# Patient Record
Sex: Male | Born: 1960 | Race: White | Hispanic: No | Marital: Married | Smoking: Former smoker
Health system: Southern US, Community
[De-identification: ages and names within clinical notes are randomized; demographics above are authoritative.]

## PROBLEM LIST (undated history)

## (undated) DIAGNOSIS — C801 Malignant (primary) neoplasm, unspecified: Secondary | ICD-10-CM

## (undated) DIAGNOSIS — M1990 Unspecified osteoarthritis, unspecified site: Secondary | ICD-10-CM

## (undated) DIAGNOSIS — G4730 Sleep apnea, unspecified: Secondary | ICD-10-CM

## (undated) DIAGNOSIS — K219 Gastro-esophageal reflux disease without esophagitis: Secondary | ICD-10-CM

## (undated) DIAGNOSIS — K635 Polyp of colon: Secondary | ICD-10-CM

## (undated) DIAGNOSIS — N400 Enlarged prostate without lower urinary tract symptoms: Secondary | ICD-10-CM

## (undated) HISTORY — DX: Polyp of colon: K63.5

## (undated) HISTORY — DX: Malignant (primary) neoplasm, unspecified: C80.1

## (undated) HISTORY — PX: CYST REMOVAL HAND: SHX6279

## (undated) HISTORY — DX: Unspecified osteoarthritis, unspecified site: M19.90

## (undated) HISTORY — PX: OTHER SURGICAL HISTORY: SHX169

## (undated) HISTORY — DX: Benign prostatic hyperplasia without lower urinary tract symptoms: N40.0

## (undated) HISTORY — DX: Sleep apnea, unspecified: G47.30

## (undated) HISTORY — PX: POLYPECTOMY: SHX149

## (undated) HISTORY — DX: Gastro-esophageal reflux disease without esophagitis: K21.9

---

## 2012-04-29 ENCOUNTER — Other Ambulatory Visit: Payer: Self-pay | Admitting: Sports Medicine

## 2012-04-29 DIAGNOSIS — M7540 Impingement syndrome of unspecified shoulder: Secondary | ICD-10-CM

## 2012-04-29 DIAGNOSIS — M1990 Unspecified osteoarthritis, unspecified site: Secondary | ICD-10-CM

## 2012-04-29 DIAGNOSIS — Z139 Encounter for screening, unspecified: Secondary | ICD-10-CM

## 2012-05-06 ENCOUNTER — Ambulatory Visit
Admission: RE | Admit: 2012-05-06 | Discharge: 2012-05-06 | Disposition: A | Discharge status: Home / Self Care | Payer: Worker's Compensation | Source: Ambulatory Visit | Attending: Sports Medicine | Admitting: Sports Medicine

## 2012-05-06 DIAGNOSIS — M7540 Impingement syndrome of unspecified shoulder: Secondary | ICD-10-CM

## 2012-05-06 DIAGNOSIS — M1990 Unspecified osteoarthritis, unspecified site: Secondary | ICD-10-CM

## 2012-05-06 DIAGNOSIS — Z139 Encounter for screening, unspecified: Secondary | ICD-10-CM

## 2012-09-02 HISTORY — PX: SHOULDER SURGERY: SHX246

## 2016-12-01 HISTORY — PX: COLONOSCOPY WITH ESOPHAGOGASTRODUODENOSCOPY (EGD): SHX5779

## 2016-12-01 HISTORY — PX: COLONOSCOPY: SHX174

## 2017-01-29 ENCOUNTER — Encounter: Payer: Self-pay | Admitting: Gastroenterology

## 2017-12-04 ENCOUNTER — Encounter: Payer: Self-pay | Admitting: Gastroenterology

## 2017-12-18 ENCOUNTER — Encounter: Payer: Self-pay | Admitting: Gastroenterology

## 2017-12-18 ENCOUNTER — Ambulatory Visit (INDEPENDENT_AMBULATORY_CARE_PROVIDER_SITE_OTHER): Payer: BC Managed Care – PPO | Admitting: Gastroenterology

## 2017-12-18 VITALS — BP 126/80 | HR 62 | Ht 72.0 in | Wt 197.5 lb

## 2017-12-18 DIAGNOSIS — R1013 Epigastric pain: Secondary | ICD-10-CM

## 2017-12-18 NOTE — Progress Notes (Signed)
      IMPRESSION and PLAN:    #1. Epigastric pain/RUQ pain (prev EGD 12/2016 showed healed distal esophageal erosions) -Protonix 40mg  po qd to continue. -US complete (CT was not approved by INS). If ultrasound is negative and he still has problems, would proceed with HIDA scan with ejection fraction possibly followed by a CT scan of the abdomen pelvis. -Rpt EGD in 3-4 [place] if not better. -Avoid nonsteroidals.     #2. H/O Polyps (colonoscopy 01/29/2017 small colonic polyp -we just got the records)      HPI:    Chief Complaint:   [age]yr old with upper abdominal pain x Feb 2019, better with eating. No NSAIDs. Started on protonix, with some relief.  He does complain of abdominal bloating.  No nausea, vomiting, heartburn, regurgitation, odynophagia or dysphagia.  No significant diarrhea or constipation currently.  There is no melena or hematochezia. No unintentional weight loss.   Normal CBC, CMP, lipase per notes from Dr Yetta FlockHodges.  We do not have the rest of the records -awaiting Deconversion.   Past Surgical History:  Procedure Laterality Date  . COLONOSCOPY  12/2016   polyps  . COLONOSCOPY   12/2016  . CYST REMOVAL HAND     from L ear, r shoulder, r e\index finger  . SHOULDER SURGERY  2014    Review of systems:       Past Medical History:  Diagnosis Date  . Colon polyps 10/2016    Current Outpatient Medications  Medication Sig Dispense Refill  . Multiple Vitamins-Minerals (MULTIVITAMIN ADULT PO) Take by mouth daily.    . pantoprazole (PROTONIX) 40 MG tablet Take 40 mg by mouth daily.    . Probiotic Product (PROBIOTIC-10 PO) Take by mouth daily.     No current facility-administered medications for this visit.     Patient's surgical history, family medical history, social history and allergies were all reviewed in Epic    Physical Exam:     BP 126/80   Pulse 62   Ht 6' (1.829 m)   Wt 197 lb 8 oz (89.6 kg)   BMI 26.79 kg/m   GENERAL:  Alert, oriented,  cooperative, not in acute distress. PSYCH: :Pleasant, normal mood and affect. HEENT:  conjunctiva pink, mucous membranes moist, neck supple without masses. No jaundice. CARDIAC:  S1 S2 normal. No murmers. PULM: Normal respiratory effort, lungs CTA bilaterally, no wheezing. ABDOMEN: Inspection: No visible peristalsis, no abnormal pulsations, skin normal.  Palpation/percussion: Soft, nontender, nondistended, no rigidity, no abnormal dullness to percussion, no hepatosplenomegaly and no palpable abdominal masses. Small umblical hernia. Auscultation: Normal bowel sounds, no abdominal bruits. Rectal exam: Deferred SKIN:  turgor, no lesions seen. Musculoskeletal:  Normal muscle tone, normal strength. NEURO: Alert and oriented x 3, no focal neurologic deficits.   Virtie Bungert,MD 12/18/2017, 9:58 AM   Cc Dr Yetta FlockHodges

## 2017-12-18 NOTE — Patient Instructions (Signed)
If you are age 55 or older, your body mass index should be between 23-30. Your Body mass index is 26.79 kg/m. If this is out of the aforementioned range listed, please consider follow up with your Primary Care Provider.  If you are age 55 or younger, your body mass index should be between 19-25. Your Body mass index is 26.79 kg/m. If this is out of the aformentioned range listed, please consider follow up with your Primary Care Provider.   You have been scheduled for an abdominal ultrasound at [facility] (1st floor [address] ) on 12/19/17 at 9:30am. Please arrive 15 minutes prior to your appointment for registration. Make certain not to have anything to eat or drink 6 hours prior to your appointment. Should you need to reschedule your appointment, please contact radiology at [phone]. This test typically takes about 30 minutes to perform.  You have been scheduled for an endoscopy. Please follow written instructions given to you at your visit today. If you use inhalers (even only as needed), please bring them with you on the day of your procedure. Your physician has requested that you go to www.startemmi.com and enter the access code given to you at your visit today. This web site gives a general overview about your procedure. However, you should still follow specific instructions given to you by our office regarding your preparation for the procedure.  Thank you,  Dr. Lynann Bolognaajesh Gupta

## 2017-12-19 ENCOUNTER — Ambulatory Visit (HOSPITAL_BASED_OUTPATIENT_CLINIC_OR_DEPARTMENT_OTHER)
Admission: RE | Admit: 2017-12-19 | Discharge: 2017-12-19 | Disposition: A | Discharge status: Home / Self Care | Payer: BC Managed Care – PPO | Source: Ambulatory Visit | Attending: Gastroenterology | Admitting: Gastroenterology

## 2017-12-19 DIAGNOSIS — D739 Disease of spleen, unspecified: Secondary | ICD-10-CM | POA: Diagnosis not present

## 2017-12-19 DIAGNOSIS — R1013 Epigastric pain: Secondary | ICD-10-CM

## 2017-12-23 ENCOUNTER — Telehealth: Payer: Self-pay | Admitting: Gastroenterology

## 2017-12-23 NOTE — Telephone Encounter (Signed)
He is requesting result from his ultrasound.  Please advise, thank you.

## 2017-12-23 NOTE — Telephone Encounter (Signed)
US - look good. Please inform patient about the results.   IMPRESSION: Possible gallbladder polyp. Mildly hyperechoic liver - FATTY LIVER 12 x 13 mm hyperechoic lesion in the splenic hilum, likely a benign lesion such as hemangioma (BENIGN FINDING - fairly common - doesnot need any further WU.  Elby Showersaj

## 2017-12-24 NOTE — Telephone Encounter (Signed)
Called patient and gave him the results of the ultrasound.

## 2017-12-24 NOTE — Telephone Encounter (Signed)
Pt returned your call from yesterday regarding his results. Pls call him again. Thank you

## 2018-01-30 ENCOUNTER — Encounter: Payer: Self-pay | Admitting: Gastroenterology

## 2018-01-30 ENCOUNTER — Ambulatory Visit (AMBULATORY_SURGERY_CENTER): Payer: BC Managed Care – PPO | Admitting: Gastroenterology

## 2018-01-30 ENCOUNTER — Other Ambulatory Visit: Payer: Self-pay

## 2018-01-30 VITALS — BP 110/76 | HR 60 | Temp 97.7°F | Resp 17 | Ht 72.0 in | Wt 197.0 lb

## 2018-01-30 DIAGNOSIS — K2970 Gastritis, unspecified, without bleeding: Secondary | ICD-10-CM

## 2018-01-30 DIAGNOSIS — K2980 Duodenitis without bleeding: Secondary | ICD-10-CM

## 2018-01-30 DIAGNOSIS — R1013 Epigastric pain: Secondary | ICD-10-CM | POA: Diagnosis not present

## 2018-01-30 MED ORDER — SODIUM CHLORIDE 0.9 % IV SOLN: 500.0000 mL | Freq: Once | INTRAVENOUS | Status: DC

## 2018-01-30 MED ORDER — PANTOPRAZOLE SODIUM 40 MG PO TBEC: 40.0000 mg | DELAYED_RELEASE_TABLET | Freq: Every day | ORAL | 3 refills | Status: AC

## 2018-01-30 NOTE — Patient Instructions (Signed)
YOU HAD AN ENDOSCOPIC PROCEDURE TODAY AT THE [facility]:   Refer to the procedure report that was given to you for any specific questions about what was found during the examination.  If the procedure report does not answer your questions, please call your gastroenterologist to clarify.  If you requested that your care partner not be given the details of your procedure findings, then the procedure report has been included in a sealed envelope for you to review at your convenience later.  YOU SHOULD EXPECT: Some feelings of bloating in the abdomen. Passage of more gas than usual.  Walking can help get rid of the air that was put into your GI tract during the procedure and reduce the bloating. If you had a lower endoscopy (such as a colonoscopy or flexible sigmoidoscopy) you may notice spotting of blood in your stool or on the toilet paper. If you underwent a bowel prep for your procedure, you may not have a normal bowel movement for a few days.  Please Note:  You might notice some irritation and congestion in your nose or some drainage.  This is from the oxygen used during your procedure.  There is no need for concern and it should clear up in a day or so.  SYMPTOMS TO REPORT IMMEDIATELY:    Following upper endoscopy (EGD)  Vomiting of blood or coffee ground material  New chest pain or pain under the shoulder blades  Painful or persistently difficult swallowing  New shortness of breath  Fever of 100F or higher  Black, tarry-looking stools  For urgent or emergent issues, a gastroenterologist can be reached at any hour by calling [phone]. NO IBUPROFEN,ALEVE NOR ASPIRIN  PER DR GUPTA.  DIET:  We do recommend a small meal at first, but then you may proceed to your regular diet.  Drink plenty of fluids but you should avoid alcoholic beverages for 24 hours.  ACTIVITY:  You should plan to take it easy for the rest of today and you should NOT DRIVE or use heavy machinery until  tomorrow (because of the sedation medicines used during the test).    FOLLOW UP: Our staff will call the number listed on your records the next business day following your procedure to check on you and address any questions or concerns that you may have regarding the information given to you following your procedure. If we do not reach you, we will leave a message.  However, if you are feeling well and you are not experiencing any problems, there is no need to return our call.  We will assume that you have returned to your regular daily activities without incident.  If any biopsies were taken you will be contacted by phone or by letter within the next 1-3 [place].  Please call us at [phone] if you have not heard about the biopsies in 3 [place].    SIGNATURES/CONFIDENTIALITY: You and/or your care partner have signed paperwork which will be entered into your electronic medical record.  These signatures attest to the fact that that the information above on your After Visit Summary has been reviewed and is understood.  Full responsibility of the confidentiality of this discharge information lies with you and/or your care-partner.  Take your new medications as directed.

## 2018-01-30 NOTE — Op Note (Signed)
[place] Patient Name: Brian Mcfarland Procedure Date: 01/30/2018 1:56 PM MRN: [id number] Endoscopist: Jackquline Denmark , MD Age: 57 Referring MD:  Date of Birth: July 03, 1961 Gender: Male Account #: [id number] Procedure:                Upper GI endoscopy Indications:              Epigastric abdominal pain Medicines:                Monitored Anesthesia Care Procedure:                Pre-Anesthesia Assessment:                           - Prior to the procedure, a History and Physical                            was performed, and patient medications and                            allergies were reviewed. The patient is competent.                            The risks and benefits of the procedure and the                            sedation options and risks were discussed with the                            patient. All questions were answered and informed                            consent was obtained. Patient identification and                            proposed procedure were verified by the physician                            in the procedure room. Mental Status Examination:                            alert and oriented. Prophylactic Antibiotics: The                            patient does not require prophylactic antibiotics.                            Prior Anticoagulants: The patient has taken no                            previous anticoagulant or antiplatelet agents. ASA                            Grade Assessment: II - A patient with mild systemic  disease. After reviewing the risks and benefits,                            the patient was deemed in satisfactory condition to                            undergo the procedure. The anesthesia plan was to                            use monitored anesthesia care (MAC). Immediately                            prior to administration of medications, the patient                            was re-assessed for  adequacy to receive sedatives.                            The heart rate, respiratory rate, oxygen                            saturations, blood pressure, adequacy of pulmonary                            ventilation, and response to care were monitored                            throughout the procedure. The physical status of                            the patient was re-assessed after the procedure.                           After obtaining informed consent, the endoscope was                            passed under direct vision. Throughout the                            procedure, the patient's blood pressure, pulse, and                            oxygen saturations were monitored continuously. The                            Model GIF-HQ190 [id number]) scope was introduced                            through the mouth, and advanced to the second part                            of duodenum. The upper GI endoscopy was  accomplished without difficulty. The patient                            tolerated the procedure well. Scope In: Scope Out: Findings:                 Localized mild inflammation characterized by                            erosions was found in the gastric antrum. Biopsies                            were taken with a cold forceps for histology.                           Localized mildly erythematous mucosa without active                            bleeding and with no stigmata of bleeding was found                            in the duodenal bulb.                           The exam was otherwise without abnormality. Complications:            No immediate complications. Estimated Blood Loss:     Estimated blood loss: none. Impression:               - Erosive gastritis. Biopsied.                           - Mild duodenitis. Recommendation:           - Resume previous diet.                           - Continue present medications - Protonix 24m po                             qd.                           - Await pathology results.                           - No ibuprofen, naproxen, or other non-steroidal                            anti-inflammatory drugs.                           - Return to GI office PRN. RJackquline Denmark MD 01/30/2018 2:14:36 PM This report has been signed electronically.

## 2018-01-30 NOTE — Progress Notes (Signed)
To recovery, report to RN, VSS. 

## 2018-01-30 NOTE — Progress Notes (Signed)
Called to room to assist during endoscopic procedure.  Patient ID and intended procedure confirmed with present staff. Received instructions for my participation in the procedure from the performing physician.  

## 2018-02-02 ENCOUNTER — Telehealth: Payer: Self-pay

## 2018-02-02 NOTE — Telephone Encounter (Signed)
  Follow up Call-  Call back number 01/30/2018  Post procedure Call Back phone  # [phone]  Permission to leave phone message Yes  Some recent data might be hidden     Patient questions:  Do you have a fever, pain , or abdominal swelling? No. Pain Score  0 *  Have you tolerated food without any problems? Yes.    Have you been able to return to your normal activities? Yes.    Do you have any questions about your discharge instructions: Diet   No. Medications  No. Follow up visit  No.  Do you have questions or concerns about your Care? No.  Actions: * If pain score is 4 or above: No action needed, pain <4.

## 2018-02-05 ENCOUNTER — Encounter: Payer: Self-pay | Admitting: Gastroenterology

## 2019-06-24 IMAGING — US US ABDOMEN COMPLETE
1 series · 14 of 25 positions shown · non-contrast
Comparison: CT abdomen pelvis 12/06/2016

CLINICAL DATA: Epigastric pain

EXAM:
ABDOMEN ULTRASOUND COMPLETE

[Series 1: us abdomen complete · 0.28mm/px · 14 of 121 slices shown]
[im 1/121]
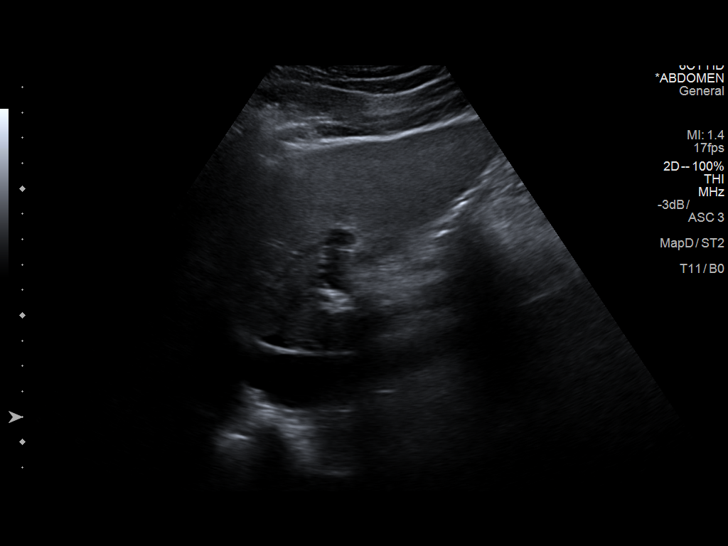
[im 11/121]
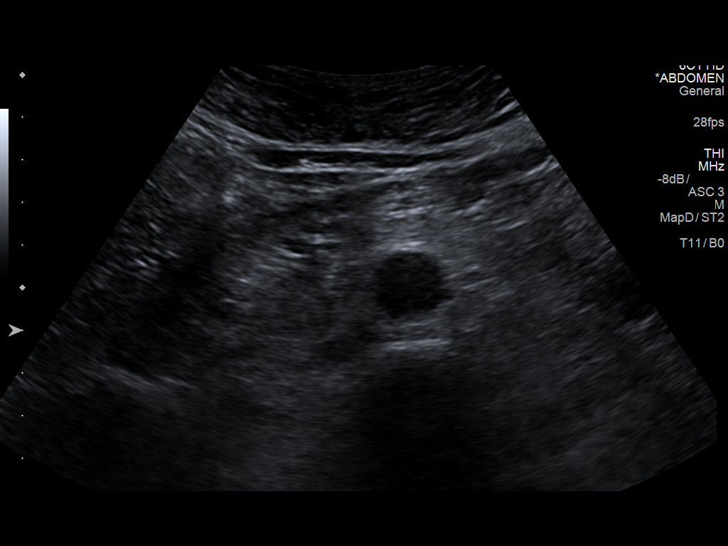
[im 21/121]
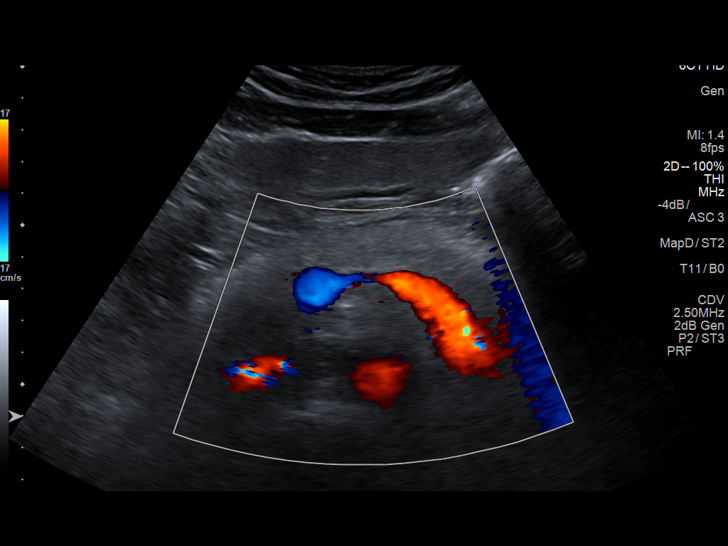
[im 31/121]
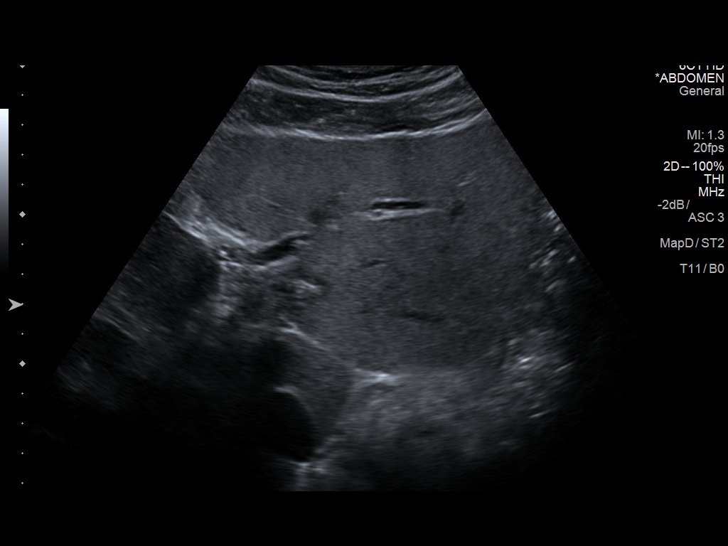
[im 41/121]
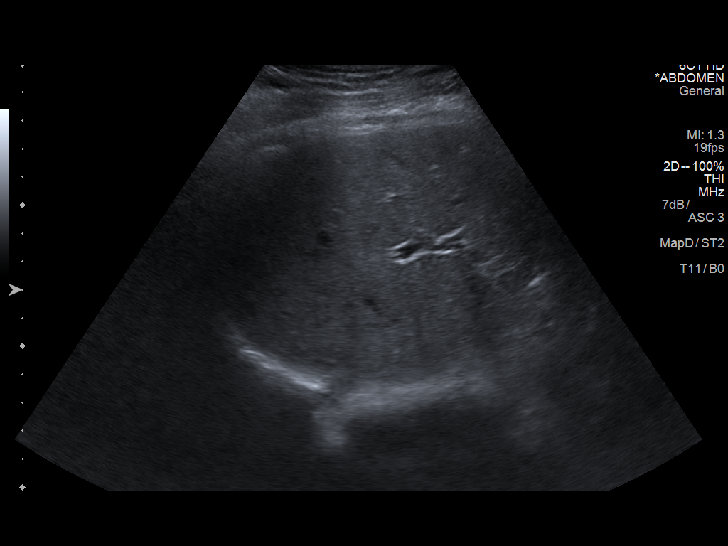
[im 46/121]
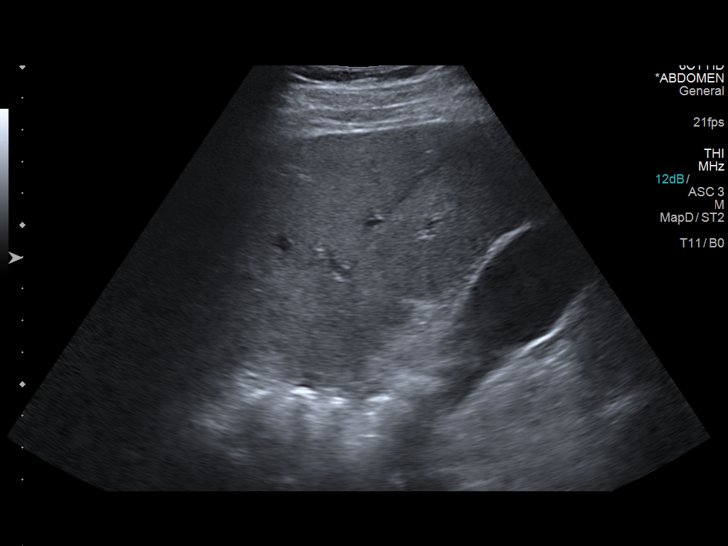
[im 56/121]
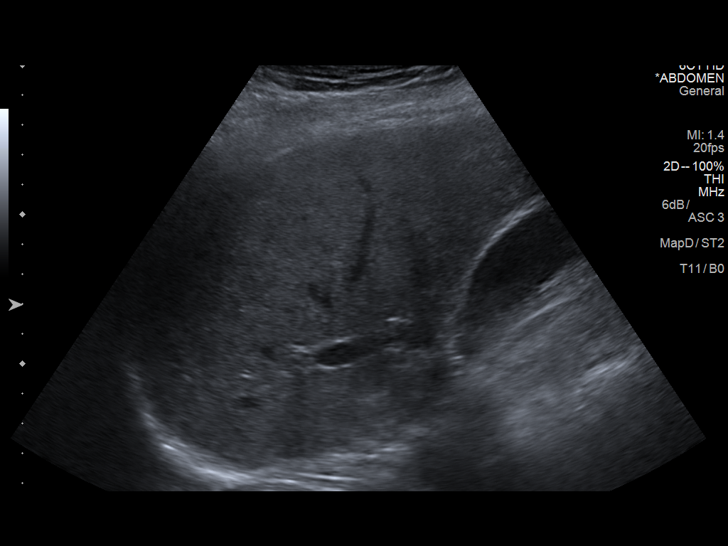
[im 66/121]
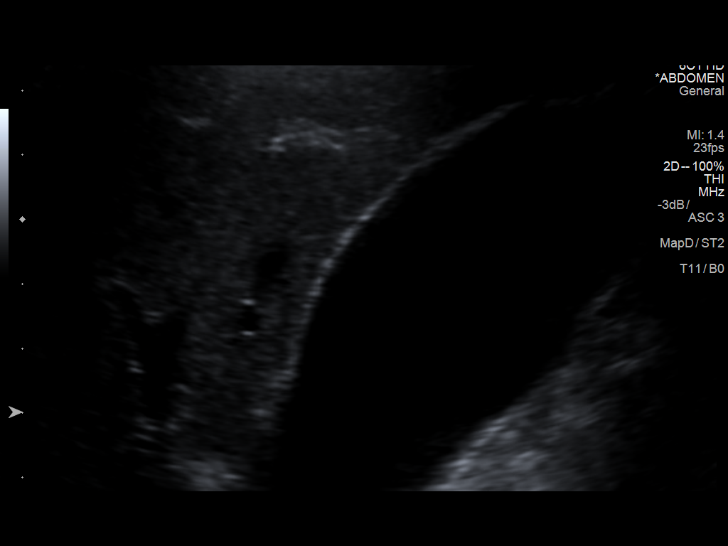
[im 76/121]
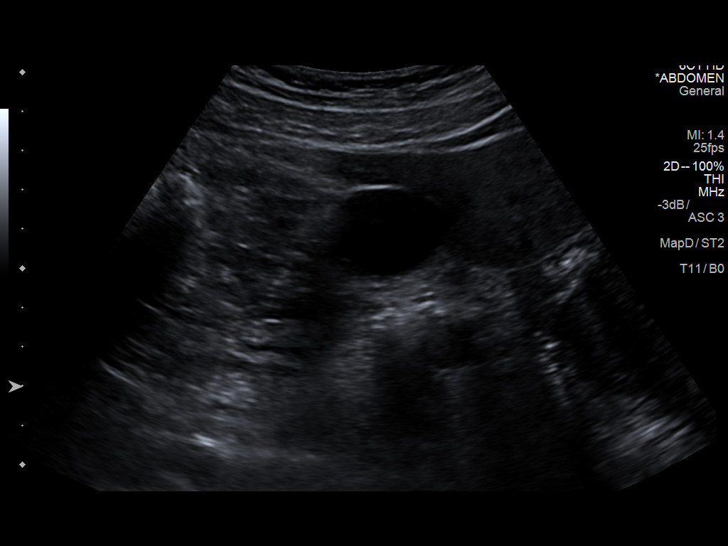
[im 81/121]
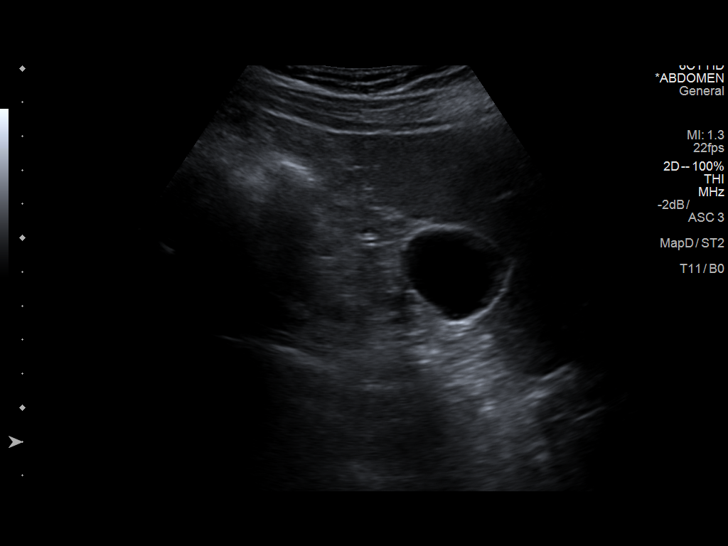
[im 91/121]
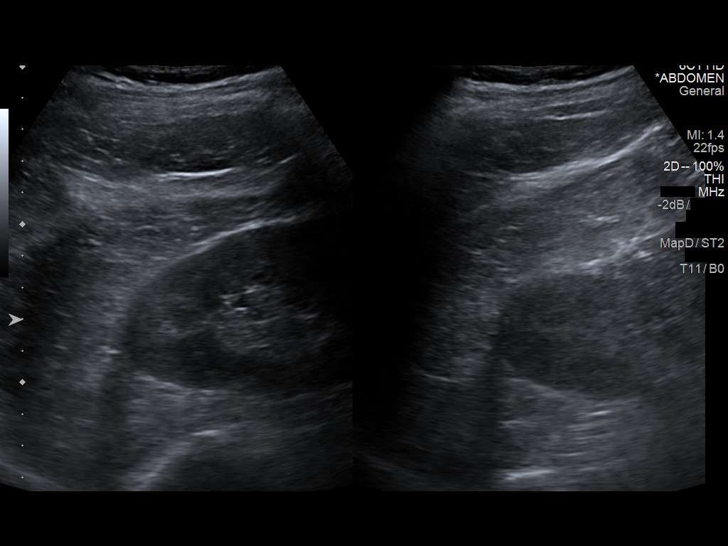
[im 101/121]
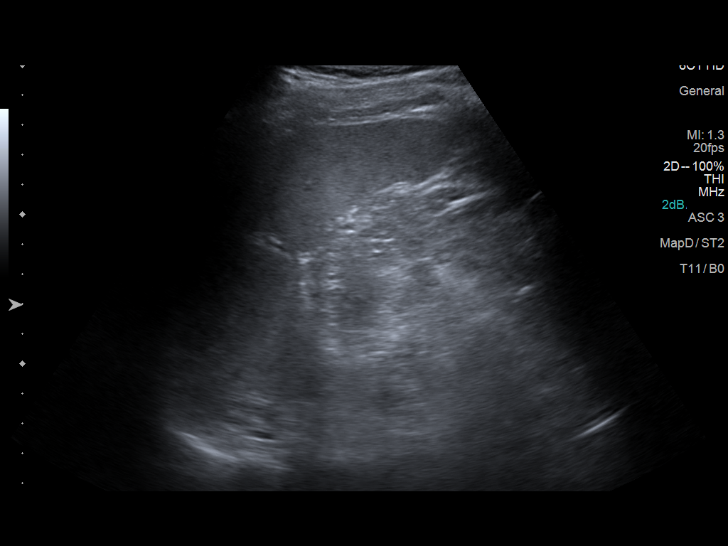
[im 111/121]
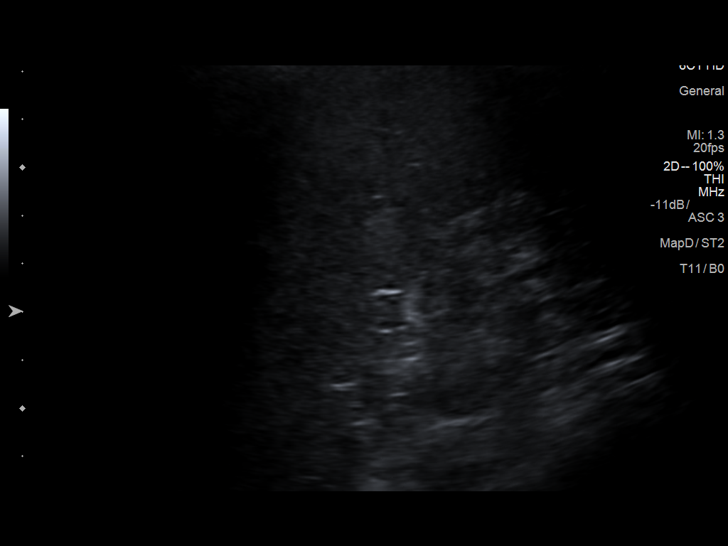
[im 121/121]
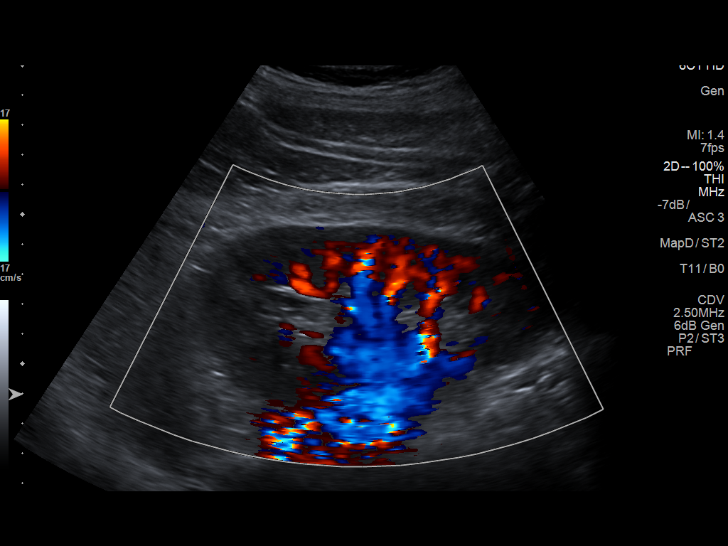

[14 of 25 positions shown; findings below may reference images not displayed]

FINDINGS: Gallbladder: Negative for gallstones. Possible 3 mm polyp on the
nondependent gallbladder wall. No gallbladder wall thickening or
pain over the gallbladder

Common bile duct: Diameter: 3.7 mm.

Liver: Mildly hyperechoic liver parenchyma without focal lesion.
Portal vein is patent on color Doppler imaging with normal direction
of blood flow towards the liver.

IVC: No abnormality visualized.

Pancreas: Visualized portion unremarkable.

Spleen: 12 x 13 mm hyperechoic lesion in the splenic hilum. This is
noted on the prior CT and measure 9.4 mm. Possible hemangioma.

Splenic size normal 6.7 cm in length.

Right Kidney: Length: 11.6 cm. Echogenicity within normal limits. No
mass or hydronephrosis visualized.

Left Kidney: Length: 12.8 cm. Echogenicity within normal limits. No
mass or hydronephrosis visualized.

Abdominal aorta: No aneurysm visualized.

Other findings: None.
IMPRESSION: Possible gallbladder polyp.

Mildly hyperechoic liver parent

12 x 13 mm hyperechoic lesion in the splenic hilum, likely a benign
lesion such as hemangioma.

## 2023-06-24 ENCOUNTER — Telehealth: Payer: Self-pay | Admitting: Gastroenterology

## 2023-06-24 NOTE — Telephone Encounter (Signed)
Hi Dr. Chales Abrahams,  Patient called to schedule a colonoscopy and EGD from a referral we received from the [place]. The patient states you are aware he has on and off upper GI issues. He is currently scheduled for the colonoscopy in December wondering if you can add the EGD for him.   Please advise. Thanks

## 2023-06-26 NOTE — Telephone Encounter (Signed)
Please schedule him for EGD at the time of colonoscopy, if schedule permits RG

## 2023-07-04 NOTE — Telephone Encounter (Signed)
Spoke with patient, he would not like to proceed with EGD at this time. He states he does not want to wait till January.

## 2023-07-15 NOTE — Telephone Encounter (Signed)
Noted on PV chart that patient's wishes are to complete the colon only at this time;

## 2023-07-22 ENCOUNTER — Encounter

## 2023-07-22 ENCOUNTER — Telehealth: Payer: Self-pay | Admitting: *Deleted

## 2023-07-22 NOTE — Telephone Encounter (Signed)
PT reschedule PV for 11/21

## 2023-07-22 NOTE — Telephone Encounter (Signed)
Attempt to reach pt for pre-visit. LM with call back #.  Will attempt to reach again in 5 min due to no other # listed in profile  Second attempt to reach pt for pre-vist unsuccessful. LM with facility # for pt to call back. Instructed pt to call # given by end of the day and reschedule the pre-visit  with RN or the scheduled procedure will be canceled.

## 2023-07-24 ENCOUNTER — Ambulatory Visit (AMBULATORY_SURGERY_CENTER): Payer: Self-pay | Admitting: *Deleted

## 2023-07-24 VITALS — Ht 72.0 in | Wt 210.0 lb

## 2023-07-24 DIAGNOSIS — Z860100 Personal history of colon polyps, unspecified: Secondary | ICD-10-CM

## 2023-07-24 DIAGNOSIS — Z800 Family history of malignant neoplasm of digestive organs: Secondary | ICD-10-CM

## 2023-07-24 MED ORDER — NA SULFATE-K SULFATE-MG SULF 17.5-3.13-1.6 GM/177ML PO SOLN: 1.0000 | Freq: Once | ORAL | 0 refills | Status: AC

## 2023-07-24 NOTE — Progress Notes (Signed)
Pt's name and DOB verified at the beginning of the pre-visit wit 2 identifiers  Pt denies any difficulty with ambulating,sitting, laying down or rolling side to side  Pt has issues with ambulation   Pt has no issues moving head neck or swallowing  No egg or soy allergy known to patient   No issues known to pt with past sedation with any surgeries or procedures  Pt denies having issues being intubated  No FH of Malignant Hyperthermia  Pt is not on diet pills or shots  Pt is not on home 02   Pt is not on blood thinners   Pt denies issues with constipation    Pt is not on dialysis  Pt denise any abnormal heart rhythms   Pt denies any upcoming cardiac testing  Pt encouraged to use to use Singlecare or Goodrx to reduce cost   Patient's chart reviewed by Cathlyn Parsons CNRA prior to pre-visit and patient appropriate for the [facility].  Pre-visit completed and red dot placed by patient's name on their procedure day (on provider's schedule).  .  Visit by phone   Pt states weight is 210 lb   Instructed pt why it is important to and  to call if they have any changes in health or new medications. Directed them to the # given and on instructions.     Instructions reviewed. Pt given both [facility] main # and MD on call # prior to instructions.  Pt states understanding. Instructed to review again prior to procedure. Pt states they will.   Instructions sent by mail with coupon and by My Chart  Coupon sent via text to mobile phone and pt verified they received it

## 2023-08-19 ENCOUNTER — Encounter: Payer: Self-pay | Admitting: Gastroenterology

## 2023-08-19 ENCOUNTER — Ambulatory Visit (AMBULATORY_SURGERY_CENTER): Payer: BC Managed Care – PPO | Admitting: Gastroenterology

## 2023-08-19 VITALS — BP 128/79 | HR 54 | Temp 97.2°F | Resp 15 | Ht 72.0 in | Wt 210.0 lb

## 2023-08-19 DIAGNOSIS — K640 First degree hemorrhoids: Secondary | ICD-10-CM | POA: Diagnosis not present

## 2023-08-19 DIAGNOSIS — K635 Polyp of colon: Secondary | ICD-10-CM

## 2023-08-19 DIAGNOSIS — D128 Benign neoplasm of rectum: Secondary | ICD-10-CM

## 2023-08-19 DIAGNOSIS — Z800 Family history of malignant neoplasm of digestive organs: Secondary | ICD-10-CM | POA: Diagnosis not present

## 2023-08-19 DIAGNOSIS — Z860100 Personal history of colon polyps, unspecified: Secondary | ICD-10-CM

## 2023-08-19 DIAGNOSIS — K5730 Diverticulosis of large intestine without perforation or abscess without bleeding: Secondary | ICD-10-CM | POA: Diagnosis not present

## 2023-08-19 DIAGNOSIS — Z1211 Encounter for screening for malignant neoplasm of colon: Secondary | ICD-10-CM

## 2023-08-19 DIAGNOSIS — K621 Rectal polyp: Secondary | ICD-10-CM | POA: Diagnosis not present

## 2023-08-19 MED ORDER — SODIUM CHLORIDE 0.9 % IV SOLN: 500.0000 mL | Freq: Once | INTRAVENOUS | Status: DC

## 2023-08-19 NOTE — Patient Instructions (Signed)
Educational handout provided to patient related to Hemorrhoids, Polyps, and Diverticulosis  Resume previous diet  Continue present medications  Awaiting pathology results  Repeat colonoscopy in 5 years for screening purposes with a 2-day prep.   YOU HAD AN ENDOSCOPIC PROCEDURE TODAY AT THE [facility]:   Refer to the procedure report that was given to you for any specific questions about what was found during the examination.  If the procedure report does not answer your questions, please call your gastroenterologist to clarify.  If you requested that your care partner not be given the details of your procedure findings, then the procedure report has been included in a sealed envelope for you to review at your convenience later.  YOU SHOULD EXPECT: Some feelings of bloating in the abdomen. Passage of more gas than usual.  Walking can help get rid of the air that was put into your GI tract during the procedure and reduce the bloating. If you had a lower endoscopy (such as a colonoscopy or flexible sigmoidoscopy) you may notice spotting of blood in your stool or on the toilet paper. If you underwent a bowel prep for your procedure, you may not have a normal bowel movement for a few days.  Please Note:  You might notice some irritation and congestion in your nose or some drainage.  This is from the oxygen used during your procedure.  There is no need for concern and it should clear up in a day or so.  SYMPTOMS TO REPORT IMMEDIATELY:  Following lower endoscopy (colonoscopy or flexible sigmoidoscopy):  Excessive amounts of blood in the stool  Significant tenderness or worsening of abdominal pains  Swelling of the abdomen that is new, acute  Fever of 100F or higher  For urgent or emergent issues, a gastroenterologist can be reached at any hour by calling [phone]. Do not use MyChart messaging for urgent concerns.    DIET:  We do recommend a small meal at first, but then you  may proceed to your regular diet.  Drink plenty of fluids but you should avoid alcoholic beverages for 24 hours.  ACTIVITY:  You should plan to take it easy for the rest of today and you should NOT DRIVE or use heavy machinery until tomorrow (because of the sedation medicines used during the test).    FOLLOW UP: Our staff will call the number listed on your records the next business day following your procedure.  We will call around 7:15- 8:00 am to check on you and address any questions or concerns that you may have regarding the information given to you following your procedure. If we do not reach you, we will leave a message.     If any biopsies were taken you will be contacted by phone or by letter within the next 1-3 [place].  Please call us at [phone] if you have not heard about the biopsies in 3 [place].    SIGNATURES/CONFIDENTIALITY: You and/or your care partner have signed paperwork which will be entered into your electronic medical record.  These signatures attest to the fact that that the information above on your After Visit Summary has been reviewed and is understood.  Full responsibility of the confidentiality of this discharge information lies with you and/or your care-partner.

## 2023-08-19 NOTE — Progress Notes (Signed)
To pacu, VSS. Report to [email or web address] 

## 2023-08-19 NOTE — Progress Notes (Signed)
[facility] History and Physical   Primary Care Physician:  Charlott Rakes, MD   Reason for Procedure:   H/O polyps 12/2016/FH CRC- Dad in mid 35s  Plan:    colon     HPI: Brian Mcfarland is a 62 y.o. male    Past Medical History:  Diagnosis Date   Arthritis    R thumb   BPH (benign prostatic hyperplasia)    Cancer (HCC)    skin   Colon polyps    GERD (gastroesophageal reflux disease)    Sleep apnea    Mild per pt no CPAP    Past Surgical History:  Procedure Laterality Date   COLONOSCOPY  12/2016   polyps   COLONOSCOPY WITH ESOPHAGOGASTRODUODENOSCOPY (EGD)  12/2016   cyst behind left ear     CYST REMOVAL HAND     from L ear, r shoulder, r e\index finger   POLYPECTOMY     SHOULDER SURGERY  2014    Prior to Admission medications   Medication Sig Start Date End Date Taking? Authorizing Provider  diclofenac Sodium (VOLTAREN) 1 % GEL APPLY 4 GRAMS TO AFFECTED AREA FOUR TIMES A DAY AS NEEDED APPLY TO RIGHT KNEE EVERY 6 HOURS AS NEEDED FOR PAIN  PLEASE ENSURE GOOD HANDWASHING TECHNIQUES AFTER [facility] APPLICATION 09/17/22 09/18/23  [provider]  fluorouracil (EFUDEX) 5 % cream Apply topically. 09/24/22   [provider]  Multiple Vitamins-Minerals (MULTIVITAMIN ADULT PO) Take by mouth daily. Patient not taking: Reported on 07/24/2023    [provider]  pantoprazole (PROTONIX) 40 MG tablet Take 1 tablet (40 mg total) by mouth daily. Patient not taking: Reported on 07/24/2023 01/30/18   Lynann Bologna, MD  Probiotic Product (PROBIOTIC-10 PO) Take by mouth daily. Patient not taking: Reported on 07/24/2023    [provider]  silodosin (RAPAFLO) 4 MG CAPS capsule SMARTSIG:1 Capsule(s) By Mouth Every Evening 07/08/23   [provider]  tadalafil (CIALIS) 5 MG tablet Take 5 mg by mouth daily. 07/08/23   [provider]    Current Outpatient Medications  Medication Sig Dispense Refill   diclofenac Sodium (VOLTAREN) 1  % GEL APPLY 4 GRAMS TO AFFECTED AREA FOUR TIMES A DAY AS NEEDED APPLY TO RIGHT KNEE EVERY 6 HOURS AS NEEDED FOR PAIN  PLEASE ENSURE GOOD HANDWASHING TECHNIQUES AFTER EACH APPLICATION     fluorouracil (EFUDEX) 5 % cream Apply topically.     Multiple Vitamins-Minerals (MULTIVITAMIN ADULT PO) Take by mouth daily. (Patient not taking: Reported on 07/24/2023)     pantoprazole (PROTONIX) 40 MG tablet Take 1 tablet (40 mg total) by mouth daily. (Patient not taking: Reported on 07/24/2023) 90 tablet 3   Probiotic Product (PROBIOTIC-10 PO) Take by mouth daily. (Patient not taking: Reported on 07/24/2023)     silodosin (RAPAFLO) 4 MG CAPS capsule SMARTSIG:1 Capsule(s) By Mouth Every Evening     tadalafil (CIALIS) 5 MG tablet Take 5 mg by mouth daily.     Current Facility-Administered Medications  Medication Dose Route Frequency Provider Last Rate Last Admin   0.9 %  sodium chloride infusion  500 mL Intravenous Once Lynann Bologna, MD       0.9 %  sodium chloride infusion  500 mL Intravenous Once Lynann Bologna, MD        Allergies as of 08/19/2023   (No Known Allergies)    Family History  Problem Relation Age of Onset   Prostate cancer Father    Colon cancer Father    Throat  cancer Maternal Grandmother    Stomach cancer Maternal Grandfather    Esophageal cancer Neg Hx    Rectal cancer Neg Hx    Colon polyps Neg Hx     Social History   Socioeconomic History   Marital status: Married    Spouse name: Not on file   Number of children: Not on file   Years of education: Not on file   Highest education level: Not on file  Occupational History   Not on file  Tobacco Use   Smoking status: Former   Smokeless tobacco: Never   Tobacco comments:    quit smoking in 1997  Vaping Use   Vaping status: Never Used  Substance and Sexual Activity   Alcohol use: Not Currently   Drug use: Never   Sexual activity: Not on file  Other Topics Concern   Not on file  Social History Narrative   Not on  file   Social Drivers of Health   Financial Resource Strain: Not on file  Food Insecurity: Not on file  Transportation Needs: Not on file  Physical Activity: Not on file  Stress: Not on file  Social Connections: Unknown (04/15/2022)   Received from [facility]   Social Network    Social Network: Not on file  Intimate Partner Violence: Unknown (04/15/2022)   Received from [facility]   HITS    Physically Hurt: Not on file    Insult or Talk Down To: Not on file    Threaten Physical Harm: Not on file    Scream or Curse: Not on file    Review of Systems: Positive for none All other review of systems negative except as mentioned in the HPI.  Physical Exam: Vital signs in last 24 hours: @VSRANGES @   General:   Alert,  Well-developed, well-nourished, pleasant and cooperative in NAD Lungs:  Clear throughout to auscultation.   Heart:  Regular rate and rhythm; no murmurs, clicks, rubs,  or gallops. Abdomen:  Soft, nontender and nondistended. Normal bowel sounds.   Neuro/Psych:  Alert and cooperative. Normal mood and affect. A and O x 3    No significant changes were identified.  The patient continues to be an appropriate candidate for the planned procedure and anesthesia.   Edman Circle, MD. [facility] 08/19/2023 9:59 AM@

## 2023-08-19 NOTE — Progress Notes (Signed)
Pt's states no medical or surgical changes since previsit or office visit. 

## 2023-08-19 NOTE — Op Note (Signed)
[facility] Patient Name: Brian Mcfarland Procedure Date: 08/19/2023 10:03 AM MRN: [id number] Endoscopist: Lynann Bologna , MD, [id number] Age: 62 Referring MD:  Date of Birth: 1961-03-30 Gender: Male Account #: [id number] Procedure:                Colonoscopy Indications:              High risk colon cancer surveillance: Personal                            history of colonic polyps 2018. FH CRC dad around                            65. Medicines:                Monitored Anesthesia Care Procedure:                Pre-Anesthesia Assessment:                           - Prior to the procedure, a History and Physical                            was performed, and patient medications and                            allergies were reviewed. The patient's tolerance of                            previous anesthesia was also reviewed. The risks                            and benefits of the procedure and the sedation                            options and risks were discussed with the patient.                            All questions were answered, and informed consent                            was obtained. Prior Anticoagulants: The patient has                            taken no anticoagulant or antiplatelet agents. ASA                            Grade Assessment: II - A patient with mild systemic                            disease. After reviewing the risks and benefits,                            the patient was deemed in satisfactory condition to  undergo the procedure.                           After obtaining informed consent, the colonoscope                            was passed under direct vision. Throughout the                            procedure, the patient's blood pressure, pulse, and                            oxygen saturations were monitored continuously. The                            CF [id number] #[id number] was introduced through the anus                             and advanced to the the cecum, identified by                            appendiceal orifice and ileocecal valve. The                            colonoscopy was performed without difficulty. The                            patient tolerated the procedure well. The quality                            of the bowel preparation was adequate to identify                            polyps greater than 5 mm in size. Some retained                            stool throughout the colon making it somewhat                            harder to visualize. Aggressive suctioning and                            aspiration was performed. Overall over 85 to 90% of                            the colonic mucosa was visualized satisfactorily.                            Of note, small and flat lesions could have been                            missed. The ileocecal valve, appendiceal orifice,  and rectum were photographed. Scope In: 10:06:48 AM Scope Out: 10:27:00 AM Scope Withdrawal Time: 0 hours 15 minutes 31 seconds  Total Procedure Duration: 0 hours 20 minutes 12 seconds  Findings:                 Two sessile polyps were found in the mid rectum.                            The polyps were 4 to 5 mm in size. These polyps                            were removed with a cold snare. Resection and                            retrieval were complete.                           A few small-mouthed diverticula were found in the                            sigmoid colon.                           Non-bleeding internal hemorrhoids were found during                            retroflexion. The hemorrhoids were small and Grade                            I (internal hemorrhoids that do not prolapse).                           The exam was otherwise without abnormality on                            direct and retroflexion views. Complications:            No immediate  complications. Estimated Blood Loss:     Estimated blood loss: none. Impression:               - Two 4 to 5 mm polyps in the mid rectum, removed                            with a cold snare. Resected and retrieved.                           - Mild sigmoid diverticulosis.                           - Non-bleeding internal hemorrhoids.                           - The examination was otherwise normal on direct                            and retroflexion views.  Recommendation:           - Patient has a contact number available for                            emergencies. The signs and symptoms of potential                            delayed complications were discussed with the                            patient. Return to normal activities tomorrow.                            Written discharge instructions were provided to the                            patient.                           - Resume previous diet.                           - Continue present medications.                           - Await pathology results.                           - Repeat colonoscopy in 5 years for screening                            purposes with a 2-day prep. Earlier, with any new                            problems.                           - The findings and recommendations were discussed                            with the patient's family. Lynann Bologna, MD 08/19/2023 10:32:38 AM This report has been signed electronically.

## 2023-08-19 NOTE — Progress Notes (Signed)
Called to room to assist during endoscopic procedure.  Patient ID and intended procedure confirmed with present staff. Received instructions for my participation in the procedure from the performing physician.  

## 2023-08-20 ENCOUNTER — Telehealth: Payer: Self-pay | Admitting: *Deleted

## 2023-08-20 NOTE — Telephone Encounter (Signed)
  Follow up Call-     08/19/2023    9:16 AM  Call back number  Post procedure Call Back phone  # [phone] -[phone]  Permission to leave phone message Yes     Patient questions:  Do you have a fever, pain , or abdominal swelling? No. Pain Score  0 *  Have you tolerated food without any problems? Yes.    Have you been able to return to your normal activities? Yes.    Do you have any questions about your discharge instructions: Diet   No. Medications  No. Follow up visit  No.  Do you have questions or concerns about your Care? No.  Actions: * If pain score is 4 or above: No action needed, pain <4.

## 2023-08-21 ENCOUNTER — Encounter: Payer: Self-pay | Admitting: Gastroenterology

## 2023-08-21 LAB — SURGICAL PATHOLOGY
